# Patient Record
Sex: Male | Born: 1985 | Race: Black or African American | Hispanic: No | Marital: Single | Smoking: Never smoker
Health system: Southern US, Community
[De-identification: ages and names within clinical notes are randomized; demographics above are authoritative.]

## PROBLEM LIST (undated history)

## (undated) DIAGNOSIS — K219 Gastro-esophageal reflux disease without esophagitis: Secondary | ICD-10-CM

## (undated) DIAGNOSIS — F209 Schizophrenia, unspecified: Secondary | ICD-10-CM

## (undated) DIAGNOSIS — K5792 Diverticulitis of intestine, part unspecified, without perforation or abscess without bleeding: Secondary | ICD-10-CM

## (undated) DIAGNOSIS — F79 Unspecified intellectual disabilities: Secondary | ICD-10-CM

## (undated) DIAGNOSIS — K3184 Gastroparesis: Secondary | ICD-10-CM

## (undated) DIAGNOSIS — K30 Functional dyspepsia: Secondary | ICD-10-CM

## (undated) DIAGNOSIS — R569 Unspecified convulsions: Secondary | ICD-10-CM

## (undated) HISTORY — PX: TONSILLECTOMY: SUR1361

---
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
## 2003-03-26 ENCOUNTER — Emergency Department (HOSPITAL_COMMUNITY): Admission: EM | Admit: 2003-03-26 | Discharge: 2003-03-26 | Payer: Self-pay | Admitting: Emergency Medicine

## 2003-03-26 ENCOUNTER — Encounter: Payer: Self-pay | Admitting: Emergency Medicine

## 2010-04-07 DIAGNOSIS — G40909 Epilepsy, unspecified, not intractable, without status epilepticus: Secondary | ICD-10-CM | POA: Insufficient documentation

## 2013-05-03 DIAGNOSIS — R5381 Other malaise: Secondary | ICD-10-CM | POA: Insufficient documentation

## 2013-05-03 DIAGNOSIS — F209 Schizophrenia, unspecified: Secondary | ICD-10-CM | POA: Insufficient documentation

## 2013-05-03 DIAGNOSIS — G35 Multiple sclerosis: Secondary | ICD-10-CM | POA: Insufficient documentation

## 2013-05-03 DIAGNOSIS — A048 Other specified bacterial intestinal infections: Secondary | ICD-10-CM | POA: Insufficient documentation

## 2013-05-26 DIAGNOSIS — K219 Gastro-esophageal reflux disease without esophagitis: Secondary | ICD-10-CM | POA: Insufficient documentation

## 2013-09-25 ENCOUNTER — Emergency Department (HOSPITAL_COMMUNITY)
Admission: EM | Admit: 2013-09-25 | Discharge: 2013-09-25 | Disposition: A | Discharge status: Home / Self Care | Payer: Medicare Other | Attending: Emergency Medicine | Admitting: Emergency Medicine

## 2013-09-25 ENCOUNTER — Encounter (HOSPITAL_COMMUNITY): Payer: Self-pay | Admitting: Emergency Medicine

## 2013-09-25 ENCOUNTER — Emergency Department (HOSPITAL_COMMUNITY): Payer: Medicare Other

## 2013-09-25 DIAGNOSIS — R1013 Epigastric pain: Secondary | ICD-10-CM | POA: Insufficient documentation

## 2013-09-25 DIAGNOSIS — K219 Gastro-esophageal reflux disease without esophagitis: Secondary | ICD-10-CM | POA: Insufficient documentation

## 2013-09-25 DIAGNOSIS — R1110 Vomiting, unspecified: Secondary | ICD-10-CM | POA: Insufficient documentation

## 2013-09-25 DIAGNOSIS — R55 Syncope and collapse: Secondary | ICD-10-CM | POA: Insufficient documentation

## 2013-09-25 DIAGNOSIS — F209 Schizophrenia, unspecified: Secondary | ICD-10-CM | POA: Insufficient documentation

## 2013-09-25 DIAGNOSIS — Z79899 Other long term (current) drug therapy: Secondary | ICD-10-CM | POA: Insufficient documentation

## 2013-09-25 DIAGNOSIS — G40909 Epilepsy, unspecified, not intractable, without status epilepticus: Secondary | ICD-10-CM | POA: Insufficient documentation

## 2013-09-25 DIAGNOSIS — R404 Transient alteration of awareness: Secondary | ICD-10-CM | POA: Insufficient documentation

## 2013-09-25 HISTORY — DX: Unspecified intellectual disabilities: F79

## 2013-09-25 HISTORY — DX: Gastro-esophageal reflux disease without esophagitis: K21.9

## 2013-09-25 HISTORY — DX: Unspecified convulsions: R56.9

## 2013-09-25 HISTORY — DX: Schizophrenia, unspecified: F20.9

## 2013-09-25 HISTORY — DX: Diverticulitis of intestine, part unspecified, without perforation or abscess without bleeding: K57.92

## 2013-09-25 LAB — BASIC METABOLIC PANEL
Chloride: 101 mEq/L (ref 96–112)
GFR calc Af Amer: >90 mL/min (ref >90)
GFR calc non Af Amer: >90 mL/min (ref >90)
Potassium: 3.6 mEq/L (ref 3.5–5.1)
Sodium: 138 mEq/L (ref 135–145)

## 2013-09-25 LAB — CBC
HCT: 39 % (ref 39.0–52.0)
MCHC: 36.9 g/dL — ABNORMAL HIGH (ref 30.0–36.0)
Platelets: 212 10*3/uL (ref 150–400)
RDW: 12.6 % (ref 11.5–15.5)
WBC: 4 10*3/uL (ref 4.0–10.5)

## 2013-09-25 LAB — POCT I-STAT TROPONIN I: Troponin i, poc: 0 ng/mL (ref 0.00–0.08)

## 2013-09-25 LAB — VALPROIC ACID LEVEL: Valproic Acid Lvl: 26 ug/mL — ABNORMAL LOW (ref 50.0–100.0)

## 2013-09-25 MED ORDER — DIVALPROEX SODIUM 250 MG PO DR TAB
250.0000 mg | DELAYED_RELEASE_TABLET | Freq: Once | ORAL | Status: AC
  Administered 2013-09-25: 250 mg via ORAL
  Filled 2013-09-25: qty 1

## 2013-09-25 MED ORDER — LIDOCAINE VISCOUS 2 % MT SOLN
15.0000 mL | Freq: Once | OROMUCOSAL | Status: AC
  Administered 2013-09-25: 15 mL via OROMUCOSAL
  Filled 2013-09-25: qty 15

## 2013-09-25 NOTE — ED Provider Notes (Signed)
CSN: [id number]     Arrival date & time 09/25/13  1401 History   None    Chief Complaint  Patient presents with  . Chest Pain   (Consider location/radiation/quality/duration/timing/severity/associated sxs/prior Treatment) The history is provided by the patient and a caregiver. No language interpreter was used.   Patient is a 28 year old African American male with past medical history of GERD, MR, and schizophrenia who comes to the emergency department today with chest pain, syncope, and vomiting. He has had vomiting for several months. Is being evaluated by GI at [facility] for this. He had an episode of vomiting today that was more severe than usual. During the vomiting he briefly lost consciousness for approximately 5 seconds. After the incident he returned to his baseline mental function quickly. When he regained consciousness he complained of some epigastric pain. The pain is worse with vomiting. It was not worse with exertion. He denies fevers, chills, diarrhea, shortness of breath, or pleuritic chest pain.  Past Medical History  Diagnosis Date  . Mental retardation   . GERD (gastroesophageal reflux disease)   . Diverticulitis   . Schizophrenia   . Seizures     Generalized epilipsy   Past Surgical History  Procedure Laterality Date  . Tonsillectomy     Family History  Problem Relation Age of Onset  . Diabetes Other    History  Substance Use Topics  . Smoking status: Never Smoker   . Smokeless tobacco: Never Used  . Alcohol Use: No    Review of Systems  Constitutional: Negative for fever and chills.  Respiratory: Negative for cough and shortness of breath.   Cardiovascular: Negative for chest pain.  Gastrointestinal: Positive for vomiting and abdominal pain. Negative for nausea, diarrhea and constipation.  Genitourinary: Negative for dysuria, urgency and frequency.  Musculoskeletal: Negative for neck pain.  Skin: Negative for wound.  All other  systems reviewed and are negative.    Allergies  Review of patient's allergies indicates no known allergies.  Home Medications   Current Outpatient Rx  Name  Route  Sig  Dispense  Refill  . clonazePAM (KLONOPIN) 1 MG tablet   Oral   Take 1 mg by mouth 2 (two) times daily as needed for anxiety.         . divalproex (DEPAKOTE ER) 250 MG 24 hr tablet   Oral   Take 250 mg by mouth daily.         Marland Kitchen docusate sodium (COLACE) 100 MG capsule   Oral   Take 100 mg by mouth 2 (two) times daily as needed for constipation.         Marland Kitchen esomeprazole (NEXIUM) 40 MG capsule   Oral   Take 40 mg by mouth daily before breakfast.         . gabapentin (NEURONTIN) 800 MG tablet   Oral   Take 800 mg by mouth 3 (three) times daily.         . ranitidine (ZANTAC) 150 MG tablet   Oral   Take 150 mg by mouth 2 (two) times daily.         . ziprasidone (GEODON) 20 MG capsule   Oral   Take 20 mg by mouth 2 (two) times daily with a meal.          BP 112/65  Pulse 73  Temp(Src) 97.7 F (36.5 C) (Oral)  Resp 18  SpO2 100% Physical Exam  Nursing note and vitals reviewed. Constitutional: He is  oriented to person, place, and time. He appears well-developed and well-nourished. No distress.  HENT:  Head: Normocephalic and atraumatic.  Eyes: Pupils are equal, round, and reactive to light.  Neck: Normal range of motion.  Cardiovascular: Normal rate, regular rhythm and normal heart sounds.   Pulmonary/Chest: Effort normal and breath sounds normal. No respiratory distress. He has no decreased breath sounds. He has no wheezes. He has no rhonchi. He has no rales. He exhibits no tenderness and no bony tenderness.  Abdominal: Soft. He exhibits no distension. There is no tenderness. There is no rigidity, no rebound, no guarding, no CVA tenderness, no tenderness at McBurney's point and negative Murphy's sign.  Musculoskeletal: He exhibits no edema and no tenderness.  Neurological: He is alert and  oriented to person, place, and time. No cranial nerve deficit or sensory deficit. He exhibits normal muscle tone. Coordination normal.  Skin: Skin is warm and dry.  Psychiatric: His affect is blunt. His speech is delayed. He is slowed.    ED Course  Procedures (including critical care time) Labs Review Labs Reviewed  CBC - Abnormal; Notable for the following:    MCHC 36.9 (*)    All other components within normal limits  BASIC METABOLIC PANEL - Abnormal; Notable for the following:    Glucose, Bld 131 (*)    All other components within normal limits  VALPROIC ACID LEVEL - Abnormal; Notable for the following:    Valproic Acid Lvl 26.0 (*)    All other components within normal limits  POCT I-STAT TROPONIN I   Imaging Review Dg Chest Port 1 View  09/25/2013   CLINICAL DATA:  Syncope and chest pain  EXAM: PORTABLE CHEST - 1 VIEW  COMPARISON:  None.  FINDINGS: The lungs are clear. Heart size and pulmonary vascularity are normal. No adenopathy. No pneumothorax. No bone lesions.  IMPRESSION: No abnormality noted.   Electronically Signed   By: Bretta Bang M.D.   On: 09/25/2013 15:11    EKG Interpretation   None       MDM  Patient is a 27 year old male with past medical history schizophrenia and vomiting who comes emergency department today with syncope and chest pain after vomiting episode. Physical exam as above. History is consistent with a vasovagal syncope in the vomiting episode. The epigastric pain is felt likely secondary to his and severe vomiting. Initial workup included chest x-ray, BMP, CBC, troponin, and a valproic acid. Protested level was low at 26. Since he has been vomiting unable to take his medications was administered a dose of valproic acid in the emergency department. Acetone was negative. CBC was unremarkable. BMP was unremarkable. Chest x-ray was unremarkable. The patient has had vomiting for approximately 2 years and is being evaluated by GI specialist is not  felt to require further evaluation for this at this time. Patient was felt to be stable for discharge. He was instructed to followup with his GI doctor as previously scheduled. He was instructed to return to the emergency department if he develops worsening pain, inability to tolerate by mouth, or any other concerns. His caregiver expressed understanding. Patient was discharged in stable condition. Labs and imaging reviewed by myself and considered and medical decision-making. Imaging was interpreted by radiology. Care was discussed with my attending Dr. Rubin Payor.    1. Vasovagal syncope   2. Vomiting   3. Epigastric pain        Bethann Berkshire, MD 09/26/13 [phone]

## 2013-09-25 NOTE — ED Notes (Signed)
Per EMS: Pt picked up from [employer], pt had a witnessed syncopal episode lasting 5-10 seconds. Pt was standing but was caught by bystander, no impact. Pt does not remember the event. C/o chest pain on the left side and nausea. A&O at baseline when EMS arrived. Given 4mg  Zofran, 324 ASA, 1 of Nitro. Hx of MR, Schizophrenia.

## 2013-09-28 NOTE — ED Provider Notes (Signed)
I saw and evaluated the patient, reviewed the resident's note and I agree with the findings and plan.  EKG Interpretation     Ventricular Rate:  76 PR Interval:  129 QRS Duration: 83 QT Interval:  368 QTC Calculation: 414 R Axis:   77 Text Interpretation:  Sinus rhythm baseline artifacte.            Patient with syncope. Doubt cardiac cause. Patient is at his baseline. Will discharge  Juliet Rude. Rubin Payor, MD 09/28/13 2148

## 2013-12-31 DIAGNOSIS — R110 Nausea: Secondary | ICD-10-CM | POA: Insufficient documentation

## 2014-01-26 ENCOUNTER — Emergency Department (HOSPITAL_COMMUNITY)
Admission: EM | Admit: 2014-01-26 | Discharge: 2014-01-26 | Disposition: A | Discharge status: Home / Self Care | Payer: Medicare Other | Attending: Emergency Medicine | Admitting: Emergency Medicine

## 2014-01-26 ENCOUNTER — Encounter (HOSPITAL_COMMUNITY): Payer: Self-pay | Admitting: Emergency Medicine

## 2014-01-26 DIAGNOSIS — G40909 Epilepsy, unspecified, not intractable, without status epilepticus: Secondary | ICD-10-CM | POA: Insufficient documentation

## 2014-01-26 DIAGNOSIS — K219 Gastro-esophageal reflux disease without esophagitis: Secondary | ICD-10-CM | POA: Insufficient documentation

## 2014-01-26 DIAGNOSIS — Z79899 Other long term (current) drug therapy: Secondary | ICD-10-CM | POA: Insufficient documentation

## 2014-01-26 DIAGNOSIS — F79 Unspecified intellectual disabilities: Secondary | ICD-10-CM | POA: Insufficient documentation

## 2014-01-26 DIAGNOSIS — F209 Schizophrenia, unspecified: Secondary | ICD-10-CM | POA: Insufficient documentation

## 2014-01-26 DIAGNOSIS — R4182 Altered mental status, unspecified: Secondary | ICD-10-CM | POA: Insufficient documentation

## 2014-01-26 HISTORY — DX: Gastroparesis: K31.84

## 2014-01-26 HISTORY — DX: Functional dyspepsia: K30

## 2014-01-26 LAB — CBC
HCT: 41.3 % (ref 39.0–52.0)
Hemoglobin: 15.6 g/dL (ref 13.0–17.0)
MCH: 32.7 pg (ref 26.0–34.0)
MCHC: 37.8 g/dL — ABNORMAL HIGH (ref 30.0–36.0)
MCV: 86.6 fL (ref 78.0–100.0)
PLATELETS: 224 10*3/uL (ref 150–400)
RBC: 4.77 MIL/uL (ref 4.22–5.81)
RDW: 12.2 % (ref 11.5–15.5)
WBC: 5.6 10*3/uL (ref 4.0–10.5)

## 2014-01-26 LAB — VALPROIC ACID LEVEL: Valproic Acid Lvl: 30.3 ug/mL — ABNORMAL LOW (ref 50.0–100.0)

## 2014-01-26 LAB — BASIC METABOLIC PANEL
BUN: 14 mg/dL (ref 6–23)
CO2: 28 meq/L (ref 19–32)
Calcium: 9.8 mg/dL (ref 8.4–10.5)
Chloride: 99 mEq/L (ref 96–112)
Creatinine, Ser: 0.75 mg/dL (ref 0.50–1.35)
GFR calc Af Amer: >90 mL/min (ref >90)
GFR calc non Af Amer: >90 mL/min (ref >90)
GLUCOSE: 137 mg/dL — AB (ref 70–99)
POTASSIUM: 4.1 meq/L (ref 3.7–5.3)
Sodium: 140 mEq/L (ref 137–147)

## 2014-01-26 LAB — CBG MONITORING, ED: Glucose-Capillary: 128 mg/dL — ABNORMAL HIGH (ref 70–99)

## 2014-01-26 NOTE — ED Provider Notes (Signed)
CSN: [id number]     Arrival date & time 01/26/14  1411 History   First MD Initiated Contact with Patient 01/26/14 1635     Chief Complaint  Patient presents with  . Altered Mental Status     (Consider location/radiation/quality/duration/timing/severity/associated sxs/prior Treatment) HPI Comments: 28 yo AA male with h/o MR, GERD, Diverticulitis, schizophrenia, seizures, gastroparesis, presents with cc of "vomiting, and mouth being held in fixed position".  History obtained from foster mother.  She states he hasn't been keeping his PM medications down.    Pt takes depakote ER, nexium, klonipin, azithromycin (for gastroparesis), neurontin, miralax, zantac, geodon.  NKDA.  PCP: [facility] Micael Hampshire, MD GI: Dr. Alycia Rossetti Psychiatrist: Dr. Caswell Corwin mother states pt was floating his teeth out, and grunting, ambulating without issue.    At time of interview - approximately 1640.  Pt is alert, conversant, and without complaint.  Pt states he "feels good".  We talked about college basketball, football, and he had baseline MS and speech.    No h/o HA, seizure, CP, SOB, cough.  Pt has h/o chronic n/v associated with gastroparesis.    Patient is a 28 y.o. male presenting with altered mental status. The history is provided by the patient and a relative (foster mother ). The history is limited by a developmental delay.  Altered Mental Status Presenting symptoms: behavior changes and disorientation   Presenting symptoms: no combativeness, no confusion, no lethargy, no memory loss, no partial responsiveness and no unresponsiveness     Past Medical History  Diagnosis Date  . Mental retardation   . GERD (gastroesophageal reflux disease)   . Diverticulitis   . Schizophrenia   . Seizures     Generalized epilipsy  . Gastroparesis   . Delayed gastric emptying    Past Surgical History  Procedure Laterality Date  . Tonsillectomy     Family History  Problem Relation  Age of Onset  . Diabetes Other    History  Substance Use Topics  . Smoking status: Never Smoker   . Smokeless tobacco: Never Used  . Alcohol Use: Yes     Comment: occ    Review of Systems  Psychiatric/Behavioral: Negative for memory loss and confusion.      Allergies  Review of patient's allergies indicates no known allergies.  Home Medications   Current Outpatient Rx  Name  Route  Sig  Dispense  Refill  . azithromycin (ZITHROMAX) 200 MG/5ML suspension   Oral   Take 250 mg by mouth daily. Started three weeks ago from today (01-26-14)         . clonazePAM (KLONOPIN) 1 MG tablet   Oral   Take 1 mg by mouth 2 (two) times daily as needed for anxiety.         . divalproex (DEPAKOTE ER) 250 MG 24 hr tablet   Oral   Take 250 mg by mouth daily.         Marland Kitchen esomeprazole (NEXIUM) 40 MG capsule   Oral   Take 40 mg by mouth daily before breakfast.         . gabapentin (NEURONTIN) 800 MG tablet   Oral   Take 800 mg by mouth 3 (three) times daily.         . polyethylene glycol (MIRALAX / GLYCOLAX) packet   Oral   Take 17 g by mouth at bedtime.         . ranitidine (ZANTAC) 150 MG tablet  Oral   Take 150 mg by mouth 2 (two) times daily.         . ziprasidone (GEODON) 20 MG capsule   Oral   Take 20 mg by mouth 2 (two) times daily with a meal.          BP 117/76  Pulse 79  Temp(Src) 97.9 F (36.6 C) (Oral)  Resp 14  Wt 120 lb (54.432 kg)  SpO2 99% Physical Exam  Nursing note and vitals reviewed. Constitutional: He appears well-developed and well-nourished.  HENT:  Head: Normocephalic and atraumatic.  Right Ear: External ear normal.  Left Ear: External ear normal.  Nose: Nose normal.  Mouth/Throat: Oropharynx is clear and moist.  Eyes: Conjunctivae are normal. Right eye exhibits no discharge. Left eye exhibits no discharge.  Neck: Normal range of motion. Neck supple. No JVD present. No tracheal deviation present.  Cardiovascular: Normal rate and  regular rhythm.  Exam reveals no friction rub.   No murmur heard. Pulmonary/Chest: Effort normal and breath sounds normal. No stridor.  Abdominal: Soft. Bowel sounds are normal. He exhibits no distension and no mass. There is no tenderness. There is no rebound and no guarding.  Genitourinary: Rectum normal and penis normal. Guaiac negative stool. No penile tenderness.  Musculoskeletal: Normal range of motion. He exhibits no edema and no tenderness.  Lymphadenopathy:    He has no cervical adenopathy.  Neurological: He is alert. He has normal reflexes. No cranial nerve deficit. Coordination normal. GCS eye subscore is 4. GCS verbal subscore is 5. GCS motor subscore is 6.  Skin: Skin is warm and dry.    ED Course  Procedures (including critical care time) Labs Review  Results for orders placed during the hospital encounter of 01/26/14  BASIC METABOLIC PANEL      Result Value Ref Range   Sodium 140  137 - 147 mEq/L   Potassium 4.1  3.7 - 5.3 mEq/L   Chloride 99  96 - 112 mEq/L   CO2 28  19 - 32 mEq/L   Glucose, Bld 137 (*) 70 - 99 mg/dL   BUN 14  6 - 23 mg/dL   Creatinine, Ser 7.820.75  0.50 - 1.35 mg/dL   Calcium 9.8  8.4 - 95.610.5 mg/dL   GFR calc non Af Amer >90  >90 mL/min   GFR calc Af Amer >90  >90 mL/min  VALPROIC ACID LEVEL      Result Value Ref Range   Valproic Acid Lvl 30.3 (*) 50.0 - 100.0 ug/mL  CBC      Result Value Ref Range   WBC 5.6  4.0 - 10.5 K/uL   RBC 4.77  4.22 - 5.81 MIL/uL   Hemoglobin 15.6  13.0 - 17.0 g/dL   HCT 21.341.3  08.639.0 - 57.852.0 %   MCV 86.6  78.0 - 100.0 fL   MCH 32.7  26.0 - 34.0 pg   MCHC 37.8 (*) 30.0 - 36.0 g/dL   RDW 46.912.2  62.911.5 - 52.815.5 %   Platelets 224  150 - 400 K/uL  CBG MONITORING, ED      Result Value Ref Range   Glucose-Capillary 128 (*) 70 - 99 mg/dL    Date: 41/32/440103/12/2013  Rate: 98  Rhythm: normal sinus rhythm  QRS Axis: right  Intervals: normal  ST/T Wave abnormalities: nonspecific ST changes and nonspecific T wave changes  Conduction  Disutrbances:none  Narrative Interpretation:   Old EKG Reviewed:     MDM   Final diagnoses:  Altered mental status  MR (mental retardation)  Seizure disorder   28 year old Afro-American male with history of mental retardation, schizophrenia, seizures, gastroparesis, and diverticulitis presents emergency Department with his foster mother who was concerned with his mental status.  Per her report she was concerned with him clenching his teeth and his abnormal eye movement. During these episodes at home he was ambulatory without associated neurologic deficits. She is also concerned about nausea and vomiting due to his gastroparesis. She states she was unsure if the above symptom or associated with his behavioral and psychologic disorder or if this was something new.  Of note the patient had a normal neurologic exam, and was alert and oriented, and no abnormal behavior was noted. He denied symptoms during entire ER stay.    Patient had a prolonged stay in the ER for observation to see if this behavior would reoccur. Labwork ordered from triage reveals blood glucose at 128, basic metabolic panel unremarkable except for glucose 137, valproic acid was low at 30, CBC unremarkable.  After prolonged observation, foster-mother requested to be discharged home. No evidence of seizure, serious bacterial illness, stroke, TIA, or other emergent etiology. Patient is without chest pain, shortness of breath, headache, or new neurologic symptoms in ER. No indication for admission or further consultation at this time  I informed her that the patient's valproic acid level was low and encouraged her to continue these medications. I also encouraged followup with primary care provider as soon as possible. She stated he could be seen within the next day or so. He also has a followup appointment with gastroenterology this week. Strict ER precautions were given and the patient's foster mother agrees with this  plan.    Darlys Gales, MD 01/26/14 [phone]

## 2014-01-26 NOTE — ED Notes (Addendum)
Pt brought in by foster mother for pt setting is mouth in fixed position and making repetative noises.  Pt has hx of seizures (foster mom does not know what kind) but has not been keeping evening meds down d/t purposeful vomiting/vomiting d/t epigastric pain (hx of schizophrenia and MR).  VS stable.  Pt with copious amount of spit that pt keeps using tissue to clean up (this is per norm).

## 2014-01-26 NOTE — Discharge Instructions (Signed)
Epilepsy Epilepsy is a disorder in which a person has repeated seizures over time. A seizure is a release of abnormal electrical activity in the brain. Seizures can cause a change in attention, behavior, or the ability to remain awake and alert (altered mental status). Seizures often involve uncontrollable shaking (convulsions).  Most people with epilepsy lead normal lives. However, people with epilepsy are at an increased risk of falls, accidents, and injuries. Therefore, it is important to begin treatment right away. CAUSES  Epilepsy has many possible causes. Anything that disturbs the normal pattern of brain cell activity can lead to seizures. This may include:   Head injury.  Birth trauma.  High fever as a child.  Stroke.  Bleeding into or around the brain.  Certain drugs.  Prolonged low oxygen, such as what occurs after CPR efforts.  Abnormal brain development.  Certain illnesses, such as meningitis, encephalitis (brain infection), malaria, and other infections.  An imbalance of nerve signaling chemicals (neurotransmitters).  SIGNS AND SYMPTOMS  The symptoms of a seizure can vary greatly from one person to another. Right before a seizure, you may have a warning (aura) that a seizure is about to occur. An aura may include the following symptoms:  Fear or anxiety.  Nausea.  Feeling like the room is spinning (vertigo).  Vision changes, such as seeing flashing lights or spots. Common symptoms during a seizure include:  Abnormal sensations, such as an abnormal smell or a bitter taste in the mouth.   Sudden, general body stiffness.   Convulsions that involve rhythmic jerking of the face, arm, or leg on one or both sides.   Sudden change in consciousness.   Appearing to be awake but not responding.   Appearing to be asleep but cannot be awakened.   Grimacing, chewing, lip smacking, drooling, tongue biting, or loss of bowel or bladder control. After a seizure,  you may feel sleepy for a while. DIAGNOSIS  Your health care provider will ask about your symptoms and take a medical history. Descriptions from any witnesses to your seizures will be very helpful in the diagnosis. A physical exam, including a detailed neurological exam, is necessary. Various tests may be done, such as:   An electroencephalogram (EEG). This is a painless test of your brain waves. In this test, a diagram is created of your brain waves. These diagrams can be interpreted by a specialist.  An MRI of the brain.   A CT scan of the brain.   A spinal tap (lumbar puncture, LP).  Blood tests to check for signs of infection or abnormal blood chemistry. TREATMENT  There is no cure for epilepsy, but it is generally treatable. Once epilepsy is diagnosed, it is important to begin treatment as soon as possible. For most people with epilepsy, seizures can be controlled with medicines. The following may also be used:  A pacemaker for the brain (vagus nerve stimulator) can be used for people with seizures that are not well controlled by medicine.  Surgery on the brain. For some people, epilepsy eventually goes away. HOME CARE INSTRUCTIONS   Follow your health care provider's recommendations on driving and safety in normal activities.  Get enough rest. Lack of sleep can cause seizures.  Only take over-the-counter or prescription medicines as directed by your health care provider. Take any prescribed medicine exactly as directed.  Avoid any known triggers of your seizures.  Keep a seizure diary. Record what you recall about any seizure, especially any possible trigger.   Make   sure the people you live and work with know that you are prone to seizures. They should receive instructions on how to help you. In general, a witness to a seizure should:   Cushion your head and body.   Turn you on your side.   Avoid unnecessarily restraining you.   Not place anything inside your  mouth.   Call for emergency medical help if there is any question about what has occurred.   Follow up with your health care provider as directed. You may need regular blood tests to monitor the levels of your medicine.  SEEK MEDICAL CARE IF:   You develop signs of infection or other illness. This might increase the risk of a seizure.   You seem to be having more frequent seizures.   Your seizure pattern is changing.  SEEK IMMEDIATE MEDICAL CARE IF:   You have a seizure that does not stop after a few moments.   You have a seizure that causes any difficulty in breathing.   You have a seizure that results in a very severe headache.   You have a seizure that leaves you with the inability to speak or use a part of your body.  Document Released: 11/13/2005 Document Revised: 09/03/2013 Document Reviewed: 06/25/2013 [employer] Patient Information 2014 [employer].  

## 2014-01-26 NOTE — ED Notes (Signed)
Pt's foster mother inguires when pt will get discharge. Dr. Redgie GrayerMasneri notified and he states he will discharge pt very soon. Pt and foster mother informed.

## 2014-04-01 DIAGNOSIS — K3184 Gastroparesis: Secondary | ICD-10-CM | POA: Insufficient documentation

## 2015-04-23 ENCOUNTER — Emergency Department (HOSPITAL_COMMUNITY): Payer: Medicare Other

## 2015-04-23 ENCOUNTER — Emergency Department (HOSPITAL_COMMUNITY)
Admission: EM | Admit: 2015-04-23 | Discharge: 2015-04-24 | Disposition: A | Discharge status: Home / Self Care | Payer: Medicare Other | Attending: Emergency Medicine | Admitting: Emergency Medicine

## 2015-04-23 ENCOUNTER — Encounter (HOSPITAL_COMMUNITY): Payer: Self-pay | Admitting: *Deleted

## 2015-04-23 DIAGNOSIS — H9209 Otalgia, unspecified ear: Secondary | ICD-10-CM | POA: Diagnosis not present

## 2015-04-23 DIAGNOSIS — R1084 Generalized abdominal pain: Secondary | ICD-10-CM | POA: Diagnosis not present

## 2015-04-23 DIAGNOSIS — G40909 Epilepsy, unspecified, not intractable, without status epilepticus: Secondary | ICD-10-CM | POA: Insufficient documentation

## 2015-04-23 DIAGNOSIS — F79 Unspecified intellectual disabilities: Secondary | ICD-10-CM | POA: Diagnosis not present

## 2015-04-23 DIAGNOSIS — Z79899 Other long term (current) drug therapy: Secondary | ICD-10-CM | POA: Diagnosis not present

## 2015-04-23 DIAGNOSIS — R05 Cough: Secondary | ICD-10-CM | POA: Diagnosis present

## 2015-04-23 DIAGNOSIS — J069 Acute upper respiratory infection, unspecified: Secondary | ICD-10-CM | POA: Diagnosis not present

## 2015-04-23 DIAGNOSIS — K219 Gastro-esophageal reflux disease without esophagitis: Secondary | ICD-10-CM | POA: Insufficient documentation

## 2015-04-23 DIAGNOSIS — F209 Schizophrenia, unspecified: Secondary | ICD-10-CM | POA: Insufficient documentation

## 2015-04-23 LAB — COMPREHENSIVE METABOLIC PANEL
ALBUMIN: 3.9 g/dL (ref 3.5–5.0)
ALT: 183 U/L — AB (ref 17–63)
AST: 241 U/L — ABNORMAL HIGH (ref 15–41)
Alkaline Phosphatase: 108 U/L (ref 38–126)
Anion gap: 10 (ref 5–15)
BUN: 14 mg/dL (ref 6–20)
CO2: 29 mmol/L (ref 22–32)
Calcium: 9 mg/dL (ref 8.9–10.3)
Chloride: 98 mmol/L — ABNORMAL LOW (ref 101–111)
Creatinine, Ser: 0.77 mg/dL (ref 0.61–1.24)
GFR calc Af Amer: >60 mL/min (ref >60)
Glucose, Bld: 94 mg/dL (ref 65–99)
POTASSIUM: 4.1 mmol/L (ref 3.5–5.1)
SODIUM: 137 mmol/L (ref 135–145)
Total Bilirubin: 0.6 mg/dL (ref 0.3–1.2)
Total Protein: 7.5 g/dL (ref 6.5–8.1)

## 2015-04-23 LAB — URINALYSIS, ROUTINE W REFLEX MICROSCOPIC
BILIRUBIN URINE: NEGATIVE
GLUCOSE, UA: NEGATIVE mg/dL
Hgb urine dipstick: NEGATIVE
KETONES UR: NEGATIVE mg/dL
Leukocytes, UA: NEGATIVE
NITRITE: NEGATIVE
Protein, ur: 30 mg/dL — AB
SPECIFIC GRAVITY, URINE: 1.029 (ref 1.005–1.030)
Urobilinogen, UA: 1 mg/dL (ref 0.0–1.0)
pH: 7.5 (ref 5.0–8.0)

## 2015-04-23 LAB — URINE MICROSCOPIC-ADD ON

## 2015-04-23 LAB — CBC WITH DIFFERENTIAL/PLATELET
BASOS ABS: 0 10*3/uL (ref 0.0–0.1)
Basophils Relative: 0 % (ref 0–1)
Eosinophils Absolute: 0.2 10*3/uL (ref 0.0–0.7)
Eosinophils Relative: 4 % (ref 0–5)
HCT: 39.1 % (ref 39.0–52.0)
Hemoglobin: 14.4 g/dL (ref 13.0–17.0)
Lymphocytes Relative: 47 % — ABNORMAL HIGH (ref 12–46)
Lymphs Abs: 2.2 10*3/uL (ref 0.7–4.0)
MCH: 31.9 pg (ref 26.0–34.0)
MCHC: 36.8 g/dL — ABNORMAL HIGH (ref 30.0–36.0)
MCV: 86.7 fL (ref 78.0–100.0)
MONOS PCT: 15 % — AB (ref 3–12)
Monocytes Absolute: 0.7 10*3/uL (ref 0.1–1.0)
NEUTROS PCT: 34 % — AB (ref 43–77)
Neutro Abs: 1.6 10*3/uL — ABNORMAL LOW (ref 1.7–7.7)
PLATELETS: 185 10*3/uL (ref 150–400)
RBC: 4.51 MIL/uL (ref 4.22–5.81)
RDW: 11.9 % (ref 11.5–15.5)
WBC: 4.8 10*3/uL (ref 4.0–10.5)

## 2015-04-23 LAB — RAPID STREP SCREEN (MED CTR MEBANE ONLY): STREPTOCOCCUS, GROUP A SCREEN (DIRECT): NEGATIVE

## 2015-04-23 LAB — LIPASE, BLOOD: LIPASE: 15 U/L — AB (ref 22–51)

## 2015-04-23 MED ORDER — AEROCHAMBER PLUS W/MASK MISC
1.0000 | Freq: Once | Status: AC
  Administered 2015-04-23: 1
  Filled 2015-04-23: qty 1

## 2015-04-23 MED ORDER — ALBUTEROL SULFATE HFA 108 (90 BASE) MCG/ACT IN AERS
2.0000 | INHALATION_SPRAY | RESPIRATORY_TRACT | Status: DC
  Administered 2015-04-23: 2 via RESPIRATORY_TRACT
  Filled 2015-04-23: qty 6.7

## 2015-04-23 MED ORDER — HYDROCODONE-HOMATROPINE 5-1.5 MG/5ML PO SYRP: 5.0000 mL | ORAL_SOLUTION | Freq: Four times a day (QID) | ORAL | Status: DC | PRN

## 2015-04-23 NOTE — ED Notes (Signed)
Pt reports cough with yellow mucus, abdominal pain, n/v, and headache since Tuesday.

## 2015-04-23 NOTE — ED Provider Notes (Signed)
CSN: [id number]     Arrival date & time 04/23/15  1947 History   First MD Initiated Contact with Patient 04/23/15 2158     Chief Complaint  Patient presents with  . Cough  . Abdominal Pain     (Consider location/radiation/quality/duration/timing/severity/associated sxs/prior Treatment) Patient is a 29 y.o. male presenting with cough and abdominal pain. The history is provided by the patient and a caregiver. No language interpreter was used.  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 He also states that he has abdominal pain. His caregiver states that she has given him Alka-Seltzer cold and flu with minimal relief. His mom is also sick at home. He denies any fever, chest pain, vomiting, dysuria, hematuria, urinary frequency. Past Medical History  Diagnosis Date  . Mental retardation   . GERD (gastroesophageal reflux disease)   . Diverticulitis   . Schizophrenia   . Seizures     Generalized epilipsy  . Gastroparesis   . Delayed gastric emptying    Past Surgical History  Procedure Laterality Date  . Tonsillectomy     Family History  Problem Relation Age of Onset  . Diabetes Other    History  Substance Use Topics  . Smoking status: Never Smoker   . Smokeless tobacco: Never Used  . Alcohol Use: Yes     Comment: occ    Review of Systems  Constitutional: Negative for fever and chills.  HENT: Positive for congestion, ear pain, sneezing, sore throat and voice change. Negative for rhinorrhea and trouble swallowing.   Eyes: Negative for itching.  Respiratory: Positive for cough.   Gastrointestinal:  Positive for abdominal pain.  All other systems reviewed and are negative.     Allergies  Review of patient's allergies indicates no known allergies.  Home Medications   Prior to Admission medications   Medication Sig Start Date End Date Taking? Authorizing Provider  Asenapine Maleate 10 MG SUBL Place 10 mg under the tongue daily.   Yes Historical Provider, MD  chlorproMAZINE (THORAZINE) 25 MG tablet Take 25 mg by mouth 3 (three) times daily as needed for nausea or vomiting.   Yes Historical Provider, MD  divalproex (DEPAKOTE ER) 250 MG 24 hr tablet Take 500 mg by mouth daily.    Yes Historical Provider, MD  esomeprazole (NEXIUM) 40 MG capsule Take 40 mg by mouth daily before breakfast.   Yes Historical Provider, MD  OXcarbazepine (TRILEPTAL) 150 MG tablet Take 150 mg by mouth daily.   Yes Historical Provider, MD  promethazine (PHENERGAN) 25 MG tablet Take 25 mg by mouth every 6 (six) hours as needed for nausea or vomiting.   Yes Historical Provider, MD  propranolol (INDERAL) 10 MG tablet Take 10 mg by mouth daily.   Yes Historical Provider, MD  ranitidine (ZANTAC) 150 MG tablet Take 150 mg by mouth daily.    Yes Historical Provider, MD  clonazePAM (KLONOPIN) 1 MG tablet Take 1 mg by mouth 2 (two) times daily as needed for anxiety.    Historical Provider, MD  gabapentin (NEURONTIN) 800 MG tablet Take 800 mg by mouth 3 (three) times daily.  Historical Provider, MD  HYDROcodone-homatropine (HYCODAN) 5-1.5 MG/5ML syrup Take 5 mLs by mouth every 6 (six) hours as needed for cough. 04/23/15   Kensley Lares Patel-Mills, PA-C  polyethylene glycol (MIRALAX / GLYCOLAX) packet Take 17 g by mouth at bedtime.    Historical Provider, MD  ziprasidone (GEODON) 20 MG capsule Take 20 mg by mouth 2 (two) times daily with a meal.    Historical Provider, MD   BP 128/76 mmHg  Pulse 78  Temp(Src) 98.3 F (36.8 C) (Oral)  Resp 16  SpO2 95% Physical Exam  Constitutional: He appears well-developed and  well-nourished.  HENT:  Head: Normocephalic and atraumatic.  Right Ear: External ear normal.  Left Ear: External ear normal.  Mouth/Throat: Uvula is midline, oropharynx is clear and moist and mucous membranes are normal. No uvula swelling. No oropharyngeal exudate, posterior oropharyngeal edema, posterior oropharyngeal erythema or tonsillar abscesses.  Eyes: Conjunctivae and EOM are normal.  Neck: Normal range of motion. Neck supple.  Cardiovascular: Normal rate and regular rhythm.   Pulmonary/Chest: Effort normal and breath sounds normal. No respiratory distress. He has no wheezes. He has no rales. He exhibits no tenderness.  Abdominal: Soft. There is generalized tenderness.    ED Course  Procedures (including critical care time) Labs Review Labs Reviewed  CBC WITH DIFFERENTIAL/PLATELET - Abnormal; Notable for the following:    MCHC 36.8 (*)    Neutrophils Relative % 34 (*)    Neutro Abs 1.6 (*)    Lymphocytes Relative 47 (*)    Monocytes Relative 15 (*)    All other components within normal limits  COMPREHENSIVE METABOLIC PANEL - Abnormal; Notable for the following:    Chloride 98 (*)    AST 241 (*)    ALT 183 (*)    All other components within normal limits  LIPASE, BLOOD - Abnormal; Notable for the following:    Lipase 15 (*)    All other components within normal limits  URINALYSIS, ROUTINE W REFLEX MICROSCOPIC (NOT AT [facility]) - Abnormal; Notable for the following:    Color, Urine AMBER (*)    Protein, ur 30 (*)    All other components within normal limits  URINE MICROSCOPIC-ADD ON - Abnormal; Notable for the following:    Squamous Epithelial / LPF FEW (*)    All other components within normal limits  RAPID STREP SCREEN (NOT AT [facility])  CULTURE, GROUP A STREP    Imaging Review Dg Chest 2 View  04/23/2015   CLINICAL DATA:  Cough and congestion for 3 days.  EXAM: CHEST  2 VIEW  COMPARISON:  09/25/2013  FINDINGS: The heart size and mediastinal contours are within normal  limits. Both lungs are clear. No pleural effusion or pneumothorax. The visualized skeletal structures are unremarkable.  IMPRESSION: Normal chest radiographs   Electronically Signed   By: Amie Portland M.D.   On: 04/23/2015 21:00     EKG Interpretation None      MDM   Final diagnoses:  Upper respiratory infection, viral  Patient presents for cough, sore throat, ear pain, generalized abdominal pain. Mom is sick at home the same symptoms. He is currently afebrile, his labs are not concerning, rapid strep is negative. I reviewed the [facility] criteria. Chest x-ray is negative for pneumonia. I have given the patient an albuterol inhaler with a spacer and Hycodan for cough. I believe this is a viral upper respiratory infection and he can follow up with his PCP.    Catha Gosselin, PA-C 04/24/15 0030  Geoffery Lyonsouglas Delo, MD 04/24/15 [phone]

## 2015-04-23 NOTE — Discharge Instructions (Signed)
Upper Respiratory Infection, Adult °An upper respiratory infection (URI) is also known as the common cold. It is often caused by a type of germ (virus). Colds are easily spread (contagious). You can pass it to others by kissing, coughing, sneezing, or drinking out of the same glass. Usually, you get better in 1 or 2 weeks.  °HOME CARE  °· Only take medicine as told by your doctor. °· Use a warm mist humidifier or breathe in steam from a hot shower. °· Drink enough water and fluids to keep your pee (urine) clear or pale yellow. °· Get plenty of rest. °· Return to work when your temperature is back to normal or as told by your doctor. You may use a face mask and wash your hands to stop your cold from spreading. °GET HELP RIGHT AWAY IF:  °· After the first few days, you feel you are getting worse. °· You have questions about your medicine. °· You have chills, shortness of breath, or brown or red spit (mucus). °· You have yellow or brown snot (nasal discharge) or pain in the face, especially when you bend forward. °· You have a fever, puffy (swollen) neck, pain when you swallow, or white spots in the back of your throat. °· You have a bad headache, ear pain, sinus pain, or chest pain. °· You have a high-pitched whistling sound when you breathe in and out (wheezing). °· You have a lasting cough or cough up blood. °· You have sore muscles or a stiff neck. °MAKE SURE YOU:  °· Understand these instructions. °· Will watch your condition. °· Will get help right away if you are not doing well or get worse. °Document Released: 05/01/2008 Document Revised: 02/05/2012 Document Reviewed: 02/18/2014 °ExitCare® Patient Information ©2015 [employer]. This information is not intended to replace advice given to you by your health care provider. Make sure you discuss any questions you have with your health care provider. ° ° °Emergency Department Resource Guide °1) Find a Doctor and Pay Out of Pocket °Although you won't have to find  out who is covered by your insurance plan, it is a good idea to ask around and get recommendations. You will then need to call the office and see if the doctor you have chosen will accept you as a new patient and what types of options they offer for patients who are self-pay. Some doctors offer discounts or will set up payment plans for their patients who do not have insurance, but you will need to ask so you aren't surprised when you get to your appointment. ° °2) Contact Your Local Health Department °Not all health departments have doctors that can see patients for sick visits, but many do, so it is worth a call to see if yours does. If you don't know where your local health department is, you can check in your phone book. The CDC also has a tool to help you locate your state's health department, and many state websites also have listings of all of their local health departments. ° °3) Find a Walk-in Clinic °If your illness is not likely to be very severe or complicated, you may want to try a walk in clinic. These are popping up all over the country in pharmacies, drugstores, and shopping centers. They're usually staffed by nurse practitioners or physician assistants that have been trained to treat common illnesses and complaints. They're usually fairly quick and inexpensive. However, if you have serious medical issues or chronic medical problems, these   are probably not your best option. ° °No Primary Care Doctor: °- Call [facility] Connect at  832-8000 - they can help you locate a primary care doctor that  accepts your insurance, provides certain services, etc. °- Physician Referral Service- 1-800-533-3463 ° °Chronic Pain Problems: °Organization         Address  Phone   Notes  °[facility]  (336) 297-2271 Patients need to be referred by their primary care doctor.  ° °Medication Assistance: °Organization         Address  Phone   Notes  °[place] [facility] [address] °[place], [place] [address] (336) 641-8030 --Must be a resident of [place] °-- Must have NO insurance coverage whatsoever (no Medicaid/ Medicare, etc.) °-- The pt. MUST have a primary care doctor that directs their care regularly and follows them in the community °  °[facility]  (866) 331-1348   °[employer]  (888) 892-1162   ° °Agencies that provide inexpensive medical care: °Organization         Address  Phone   Notes  °[facility]  (336) 832-8035   °[facility]    (336) 832-7272   °[facility] [address] °[place], [place] [address] (336) 832-4777   °[facility] [address], °[place] (336) 271-4999   °[facility]    (336) 373-0678   °[facility]    (336) 272-1050   °[facility] ° [address], [place] Phone:  (336) 832-4444, Fax:  (336) 832-4440 Hours of Operation:  9 am - 6 pm, M-F.  Also accepts Medicaid/Medicare and self-pay.  °[facility] ° [address], [place] Phone: (336) 832-3150, Fax: (336) [phone]. Hours of Operation:  8:30 am - 5:30 pm, M-F.  Also accepts Medicaid and self-pay.  °[facility] [place] [address], [place] Phone: (336) 878-6027   °[facility] [address], [place], [place] (336)723-1848, Ext. 123 Mondays & Thursdays: 7-9 AM.  First 15 patients are seen on a first come, first serve basis. °  ° °Medicaid-accepting [place] Providers: ° °Organization         Address  Phone   Notes  °[facility] [address], [place] (336) 641-2100 Also accepts self-pay patients.  °[facility] [address], [place] ° (336) 856-9996   °[facility] [address], [place] (336) 288-8857   °[facility] [address] [place] [address], [place] (336) 299-7000   °Veita Bland  [address], [place]  ° (336) 373-1557 Only accepts [place] Access Medicaid patients after they have their name applied to their card.  ° °Self-Pay (no insurance) in [place]: ° °Organization         Address  Phone   Notes  °Sickle Cell Patients, [facility] [address], [place] (336) 832-1970   °[facility] [address], [place] (336) 832-4400   °[facility] ° [address], [place] (336) 992-4800   °[facility]/Dr. Osei-Bonsu ° [address] [place] [address], [place] or [address], [place] (336) [phone] Phone number for both [place] and [place] locations is the same.  °[facility] [address], [place] (336) 299-0000   °[facility] [address] [place] [address], [place] or [address] (336) 852-7530 °(336) 878-2260   °[facility] [address], [place] (336) 350-1642, phone; (336) 294-5005, fax Sees patients 1st and 3rd Saturday of every month.  Must not qualify for public or private insurance (i.e. Medicaid, Medicare, [place] Health Choice, Veterans' Benefits) • Household income should be no more than 200% of the poverty level •The clinic cannot treat you if you are pregnant or think you are pregnant • Sexually transmitted diseases are not treated at the clinic.  ° ° °Dental Care: °Organization         Address  Phone  Notes  °[facility] [address], [place] (336) 641-6152 Accepts children up to age 21 who are enrolled in Medicaid or [place] Health Choice; pregnant women with a Medicaid card; and children who have applied for Medicaid or [place] Health Choice, but were declined, whose parents can pay a reduced fee at time of service.  °[facility] [place]  [address], [place] (336) 641-7733 Accepts children up to age 21  who are enrolled in Medicaid or [place] Health Choice; pregnant women with a Medicaid card; and children who have applied for Medicaid or [place] Health Choice, but were declined, whose parents can pay a reduced fee at time of service.  °[facility] ° [address], [place] (336) 641-4533 Patients are seen by appointment only. Walk-ins are not accepted. [facility] will see patients 18 years of age and older. °Monday - Tuesday (8am-5pm) °Most Wednesdays (8:30-5pm) °$30 per visit, cash only  °[facility] ° [address], [place] (336) 641-4533 Patients are seen by appointment only. Walk-ins are not accepted. [facility] will see patients 18 years of age and older. °One Wednesday Evening (Monthly: Volunteer Based).  $30 per visit, cash only  °[facility]  (919) 537-3737 for adults; Children under age 4, call Graduate Pediatric Dentistry at (919) [phone]. Children aged 4-14, please call (919) [phone] to request a pediatric application. ° Dental services are provided in all areas of dental care including fillings, crowns and bridges, complete and partial dentures, implants, gum treatment, root canals, and extractions. Preventive care is also provided. Treatment is provided to both adults and children. °Patients are selected via a lottery and there is often a waiting list. °  °[facility] [address], °[place] ° (336) 763-8833 [email or web address] °  °[facility] [address], [place], [place] (336)723-1848, Ext. 123 Second and Fourth Thursday of each month, opens at 6:30 AM; Clinic ends at 9 AM.  Patients are seen on a first-come first-served basis, and a limited number are seen during each clinic.  ° °[facility] ° [address], [place], [place] (336) 723-7904   Eligibility Requirements °You must have lived in [place], [place], or [place] counties for at least the last three months. °   You cannot be eligible for state or federal sponsored healthcare insurance, including Veterans Administration, Medicaid, or Medicare. °  You generally cannot be eligible for healthcare insurance through your employer.  °  How to apply: °Eligibility screenings are held every Tuesday and Wednesday afternoon from 1:00 pm until 4:00 pm. You do not need an appointment for the interview!  °[facility] [address], [place], [place] 336-631-2330   °[facility] [facility]  336-342-8273   °[facility]  336-703-3100   °[facility] [place] [facility]  336-570-6415   ° °Behavioral Health Resources in the Community: °Intensive Outpatient Programs °Organization         Address  Phone  Notes  °[facility] [address], [place], [place] 336-878-6098   °[facility] [address], [place], [place] 336-832-9800   °[facility] [address], [place], [place] ° 336-882-2125   °[facility] [address],  °[place], [place] 1-800-853-5163 or 336-641-4981   °Substance Abuse Resources °Organization         Address  Phone  Notes  °[facility]  336-882-2125   °[facility]  336-784-9470   °[facility]  336-285-9073   °[facility]  336-845-3988   °Residential & Outpatient Substance Abuse Program  1-800-659-3381   °Psychological Services °Organization         Address  Phone  Notes  °[facility]  336- 832-9600   °[facility]  336- 378-7881   °[facility] [address], [place] 1-800-853-5163 or 336-641-4981   ° °Mobile Crisis Teams °Organization         Address  Phone  Notes  °[facility], Mobile Crisis Care Unit  1-877-626-1772   °Assertive °[facility] ° [address] [place], [place] 336-834-9664   °Sharon DeEsch [address] °[place] 336-554-5454   ° °Self-Help/Support  Groups °Organization         Address  Phone             Notes  °[facility] [place] - variety of support groups  336- 373-1402 Call for more information  °Narcotics Anonymous (NA), [facility] [address], °[place]  2 meetings at this location  ° °Residential Treatment Programs °Organization         Address  Phone  Notes  °[facility] [address],    °[place]  1-866-801-8205   °[facility] ° [address], [place], [place] 704-293-8524   °[facility] [address], [place] 336-845-3988 Admissions: 8am-3pm M-F  °[facility] [address]    °[place], [place] 336-841-1104   °[facility] [address], [place], [place] 336-379-7146   °[facility] [address]  °[place], [place] 336-285-9073   °[facility] - Intensive Outpatient [address], [place], [place] 336-852-3033   °[facility] [address]  °[place], [place] 1-877-615-2722 or 336-784-9470   °[facility] [address] [place], [place] 336-227-7417 Accepts Medicaid  °[facility] [address]  °[place] 1-800-659-3381 Substance Abuse/Addiction Treatment  ° °[place] Behavioral Health Resources °Organization         Address  Phone  Notes  °[facility]  (888) 581-9988   °Julie Brannon, PhD [address] [place], [place]   (336) 349-5553 or (336) 951-0000   °[facility]   [address] °[place], [place] (336) 349-4454   °[facility] [address], [place], [place] (336) 342-8316 Insurance/Medicaid/sponsorship through [facility]  °[facility] [address]                                      [place], [place] (336) 342-8316 Therapy/tele-psych/case  °[facility] [address]  ° [place], [place] (336) 349-2233    °Dr. Arfeen  (336) 349-4544   °[facility] [place]  [employer] [place] [facility] 1) [address], [place] °2) [address], [place] °3)  [address], [place] (336) 349-3220 °(336) 342-7768 ° °(336) 342-8140   °[place] [facility] (336) 342-1394 or (336) 342-3537 (After Hours)    ° ° ° ° °

## 2015-04-24 DIAGNOSIS — J069 Acute upper respiratory infection, unspecified: Secondary | ICD-10-CM | POA: Diagnosis not present

## 2015-04-27 LAB — CULTURE, GROUP A STREP: Strep A Culture: NEGATIVE

## 2016-09-01 IMAGING — DX DG CHEST 2V
2 series · 2 of 2 positions shown · non-contrast
Comparison: 09/25/2013

CLINICAL DATA: Cough and congestion for 3 days.

EXAM:
CHEST  2 VIEW

[chest lat]
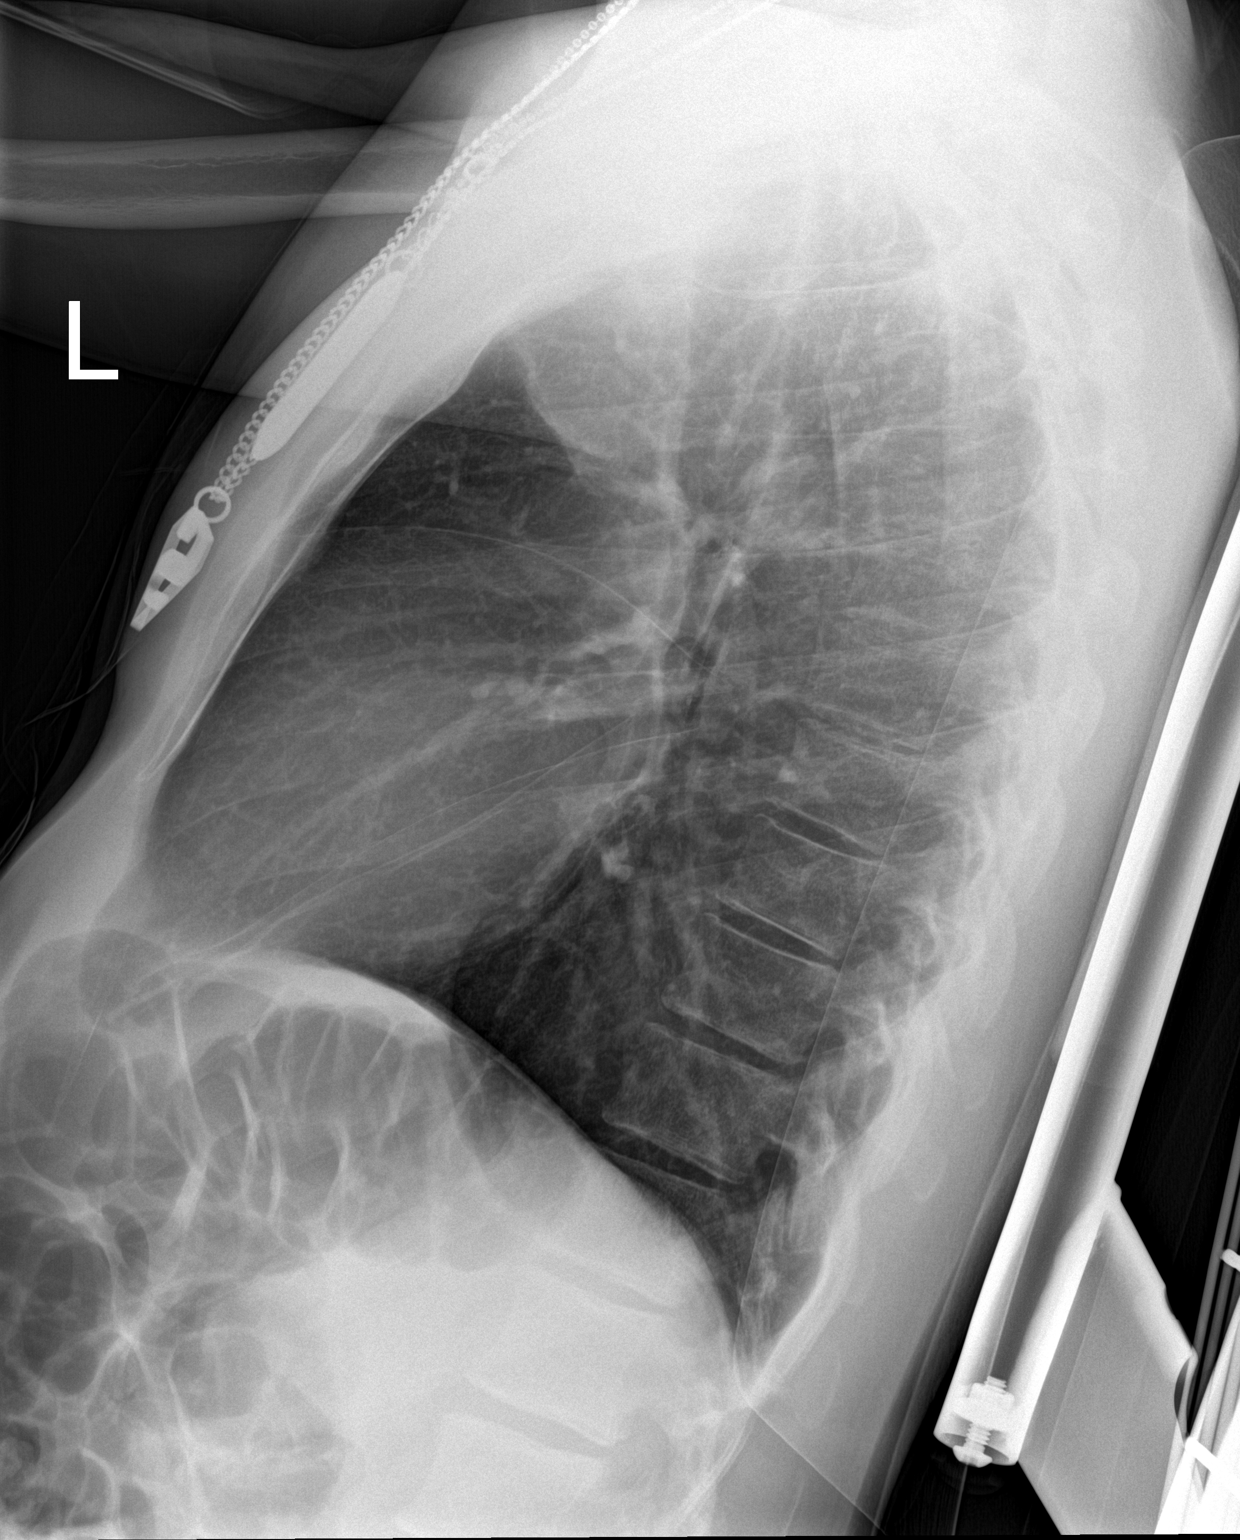

[chest ap]
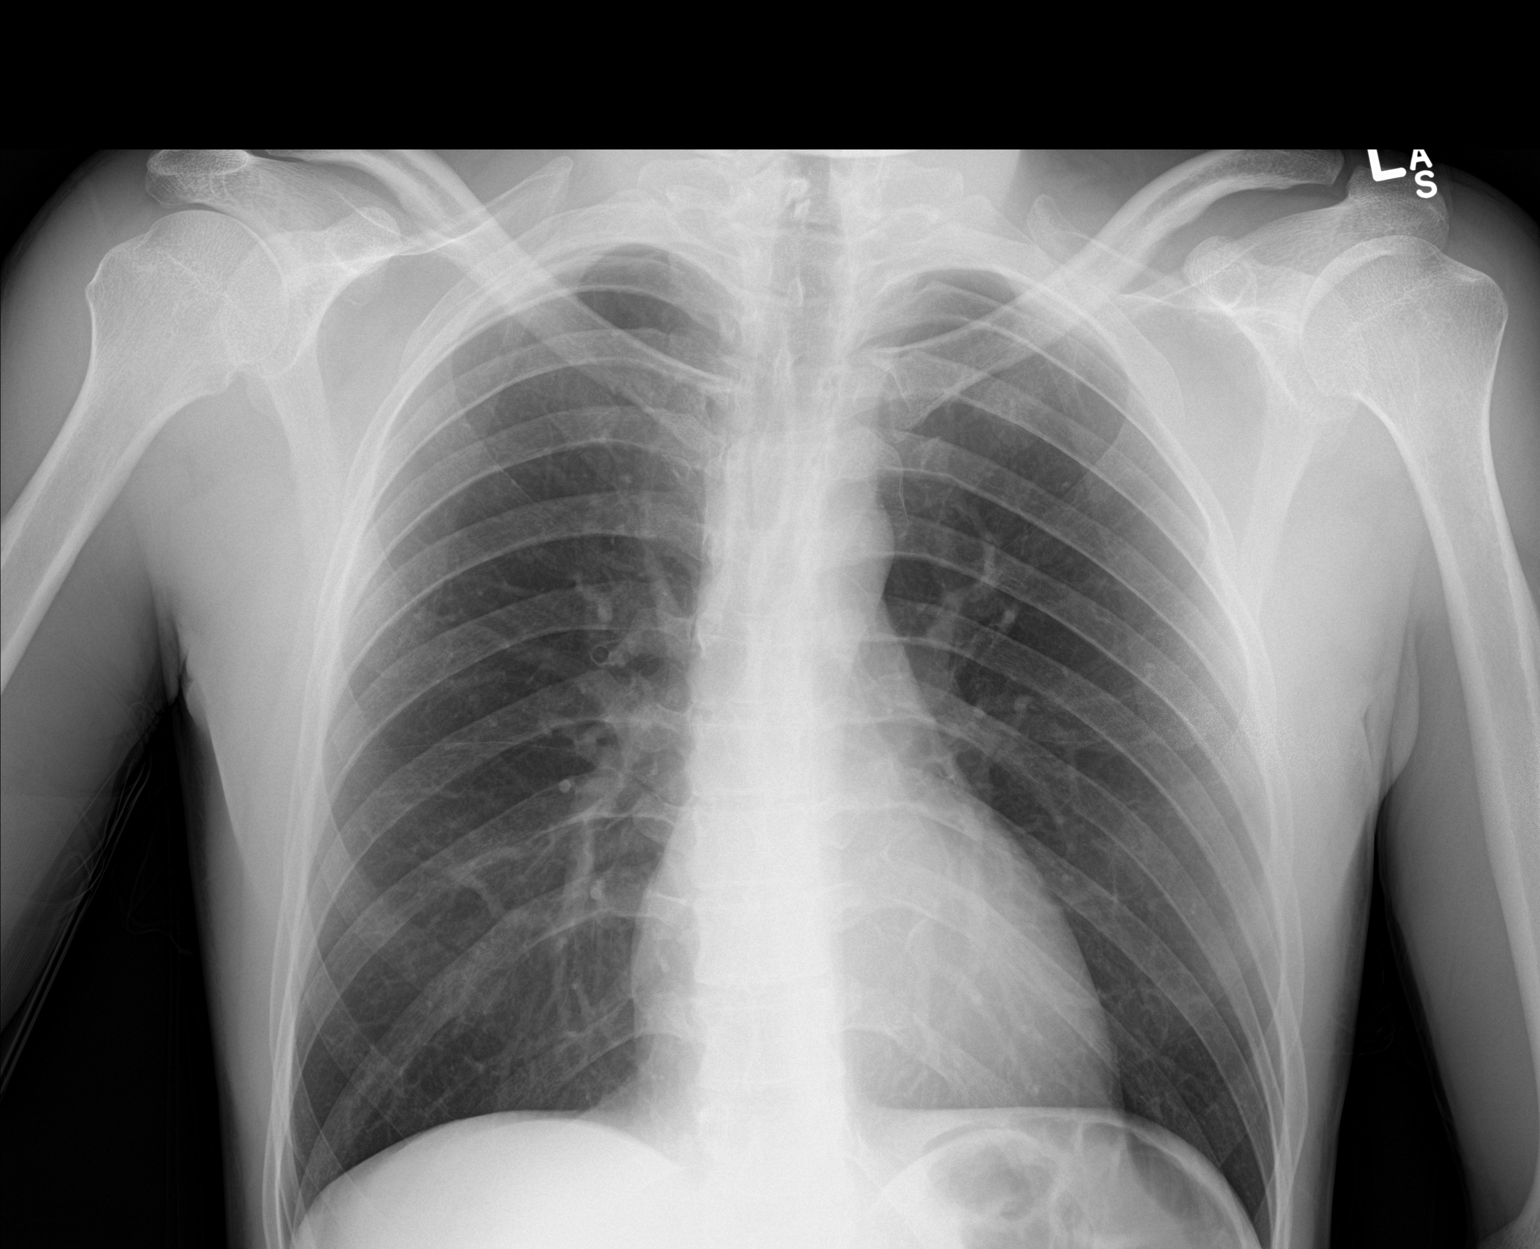

[2 of 2 positions shown; findings below may reference images not displayed]

FINDINGS: The heart size and mediastinal contours are within normal limits.
Both lungs are clear. No pleural effusion or pneumothorax. The
visualized skeletal structures are unremarkable.
IMPRESSION: Normal chest radiographs

## 2020-11-27 HISTORY — PX: CHOLECYSTECTOMY: SHX55

## 2023-01-11 DIAGNOSIS — F70 Mild intellectual disabilities: Secondary | ICD-10-CM | POA: Insufficient documentation

## 2023-01-11 DIAGNOSIS — F639 Impulse disorder, unspecified: Secondary | ICD-10-CM | POA: Insufficient documentation

## 2023-01-11 DIAGNOSIS — F0630 Mood disorder due to known physiological condition, unspecified: Secondary | ICD-10-CM | POA: Insufficient documentation

## 2023-08-22 ENCOUNTER — Ambulatory Visit
Admission: EM | Admit: 2023-08-22 | Discharge: 2023-08-22 | Disposition: A | Discharge status: Home / Self Care | Payer: Medicare Other | Attending: Internal Medicine | Admitting: Internal Medicine

## 2023-08-22 DIAGNOSIS — J209 Acute bronchitis, unspecified: Secondary | ICD-10-CM | POA: Diagnosis not present

## 2023-08-22 MED ORDER — ALBUTEROL SULFATE HFA 108 (90 BASE) MCG/ACT IN AERS
1.0000 | INHALATION_SPRAY | Freq: Four times a day (QID) | RESPIRATORY_TRACT | 0 refills | Status: AC | PRN
Start: 2023-08-22 — End: ?

## 2023-08-22 MED ORDER — AMOXICILLIN 875 MG PO TABS
875.0000 mg | ORAL_TABLET | Freq: Two times a day (BID) | ORAL | 0 refills | Status: AC
Start: 2023-08-22 — End: 2023-09-01

## 2023-08-22 MED ORDER — BENZONATATE 200 MG PO CAPS
200.0000 mg | ORAL_CAPSULE | Freq: Three times a day (TID) | ORAL | 0 refills | Status: DC | PRN
Start: 2023-08-22 — End: 2024-06-27

## 2023-08-22 NOTE — ED Triage Notes (Signed)
Pt presents with c/o nasal congestion and emesis X 2 wks. Pt care giver reports he has taken mucinex and it caused his stomach to hurt.

## 2023-08-22 NOTE — Discharge Instructions (Signed)
Start amoxicillin twice daily for 10 days.  Albuterol inhaler as needed for wheezing.  Tessalon as needed for cough.  Lots of rest and fluids.  Please follow-up with your PCP in 2 days for recheck.  Please go to the ER if you develop any worsening symptoms.  I hope you feel better soon!

## 2023-08-22 NOTE — ED Provider Notes (Signed)
[facility]    CSN: [id number] Arrival date & time: 08/22/23  1123      History   Chief Complaint Chief Complaint  Patient presents with   Nasal Congestion   Emesis    HPI Darius Padilla is a 37 y.o. male  presents for evaluation of URI symptoms for 2 weeks. Patient is accompanied by his caregiver.  Patient reports associated symptoms of cough, congestion, wheezing. Denies N/V/D, fevers, ear pain, shortness of breath, sore throat. Patient does not have a hx of asthma.   Pt has taken Mucinex OTC for symptoms.  Patient has a history of gastroparesis and states the Mucinex aggravates this causing him to vomit.  Pt has no other concerns at this time.    Emesis Associated symptoms: cough     Past Medical History:  Diagnosis Date   Delayed gastric emptying    Diverticulitis    Gastroparesis    GERD (gastroesophageal reflux disease)    Mental retardation    Schizophrenia (HCC)    Seizures (HCC)    Generalized epilipsy    There are no problems to display for this patient.   Past Surgical History:  Procedure Laterality Date   TONSILLECTOMY         Home Medications    Prior to Admission medications   Medication Sig Start Date End Date Taking? Authorizing Provider  albuterol (VENTOLIN HFA) 108 (90 Base) MCG/ACT inhaler Inhale 1-2 puffs into the lungs every 6 (six) hours as needed for wheezing or shortness of breath. 08/22/23  Yes Radford Pax, NP  amoxicillin (AMOXIL) 875 MG tablet Take 1 tablet (875 mg total) by mouth 2 (two) times daily for 10 days. 08/22/23 09/01/23 Yes Radford Pax, NP  benzonatate (TESSALON) 200 MG capsule Take 1 capsule (200 mg total) by mouth 3 (three) times daily as needed. 08/22/23  Yes Radford Pax, NP  Asenapine Maleate 10 MG SUBL Place 10 mg under the tongue daily.    [provider]  chlorproMAZINE (THORAZINE) 25 MG tablet Take 25 mg by mouth 3 (three) times daily as needed for nausea or vomiting.    [provider]   clonazePAM (KLONOPIN) 1 MG tablet Take 1 mg by mouth 2 (two) times daily as needed for anxiety.    [provider]  divalproex (DEPAKOTE ER) 250 MG 24 hr tablet Take 500 mg by mouth daily.     [provider]  esomeprazole (NEXIUM) 40 MG capsule Take 40 mg by mouth daily before breakfast.    [provider]  gabapentin (NEURONTIN) 800 MG tablet Take 800 mg by mouth 3 (three) times daily.    [provider]  HYDROcodone-homatropine (HYCODAN) 5-1.5 MG/5ML syrup Take 5 mLs by mouth every 6 (six) hours as needed for cough. 04/23/15   Patel-Mills, Lorelle Formosa, PA-C  OXcarbazepine (TRILEPTAL) 150 MG tablet Take 150 mg by mouth daily.    [provider]  polyethylene glycol (MIRALAX / GLYCOLAX) packet Take 17 g by mouth at bedtime.    [provider]  promethazine (PHENERGAN) 25 MG tablet Take 25 mg by mouth every 6 (six) hours as needed for nausea or vomiting.    [provider]  propranolol (INDERAL) 10 MG tablet Take 10 mg by mouth daily.    [provider]  ranitidine (ZANTAC) 150 MG tablet Take 150 mg by mouth daily.     [provider]  ziprasidone (GEODON) 20 MG capsule Take 20 mg by mouth 2 (two)  times daily with a meal.    [provider]    Family History Family History  Problem Relation Age of Onset   Diabetes Other     Social History Social History   Tobacco Use   Smoking status: Never   Smokeless tobacco: Never  Substance Use Topics   Alcohol use: Yes    Comment: occ   Drug use: No     Allergies   Patient has no known allergies.   Review of Systems Review of Systems  HENT:  Positive for congestion.   Respiratory:  Positive for cough.   Gastrointestinal:  Positive for vomiting.     Physical Exam Triage Vital Signs ED Triage Vitals [08/22/23 1135]  Encounter Vitals Group     BP 125/88     Systolic BP Percentile      Diastolic BP Percentile      Pulse Rate 83     Resp 18      Temp 98.1 F (36.7 C)     Temp Source Oral     SpO2 96 %     Weight      Height      Head Circumference      Peak Flow      Pain Score 4     Pain Loc      Pain Education      Exclude from Growth Chart    No data found.  Updated Vital Signs BP 125/88   Pulse 83   Temp 98.1 F (36.7 C) (Oral)   Resp 18   SpO2 96%   Visual Acuity Right Eye Distance:   Left Eye Distance:   Bilateral Distance:    Right Eye Near:   Left Eye Near:    Bilateral Near:     Physical Exam Vitals and nursing note reviewed.  Constitutional:      General: He is not in acute distress.    Appearance: Normal appearance. He is not ill-appearing or toxic-appearing.  HENT:     Head: Normocephalic and atraumatic.     Right Ear: Tympanic membrane and ear canal normal.     Left Ear: Tympanic membrane and ear canal normal.     Nose: Congestion present.     Mouth/Throat:     Mouth: Mucous membranes are moist.     Pharynx: No posterior oropharyngeal erythema.  Eyes:     Pupils: Pupils are equal, round, and reactive to light.  Cardiovascular:     Rate and Rhythm: Normal rate and regular rhythm.     Heart sounds: Normal heart sounds.  Pulmonary:     Effort: Pulmonary effort is normal.     Breath sounds: Normal breath sounds.  Musculoskeletal:     Cervical back: Normal range of motion and neck supple.  Lymphadenopathy:     Cervical: No cervical adenopathy.  Skin:    General: Skin is warm and dry.  Neurological:     General: No focal deficit present.     Mental Status: He is alert and oriented to person, place, and time.  Psychiatric:        Mood and Affect: Mood normal.        Behavior: Behavior normal.      UC Treatments / Results  Labs (all labs ordered are listed, but only abnormal results are displayed) Labs Reviewed - No data to display  EKG   Radiology No results found.  Procedures Procedures (including critical care time)  Medications Ordered in UC  Medications - No data to  display  Initial Impression / Assessment and Plan / UC Course  I have reviewed the triage vital signs and the nursing notes.  Pertinent labs & imaging results that were available during my care of the patient were reviewed by me and considered in my medical decision making (see chart for details).     Reviewed exam and symptoms with patient and caregiver no red flags.  Albuterol inhaler as needed for wheezing.  Tessalon as needed for cough.  Amoxicillin twice daily for 10 days given length of symptoms.  Advised PCP follow-up 2 days for recheck.  ER precautions reviewed with patient and caregiver verbalized understanding. Final Clinical Impressions(s) / UC Diagnoses   Final diagnoses:  Acute bronchitis, unspecified organism     Discharge Instructions      Start amoxicillin twice daily for 10 days.  Albuterol inhaler as needed for wheezing.  Tessalon as needed for cough.  Lots of rest and fluids.  Please follow-up with your PCP in 2 days for recheck.  Please go to the ER if you develop any worsening symptoms.  I hope you feel better soon!    ED Prescriptions     Medication Sig Dispense Auth. Provider   albuterol (VENTOLIN HFA) 108 (90 Base) MCG/ACT inhaler Inhale 1-2 puffs into the lungs every 6 (six) hours as needed for wheezing or shortness of breath. 1 each Radford Pax, NP   benzonatate (TESSALON) 200 MG capsule Take 1 capsule (200 mg total) by mouth 3 (three) times daily as needed. 20 capsule Radford Pax, NP   amoxicillin (AMOXIL) 875 MG tablet Take 1 tablet (875 mg total) by mouth 2 (two) times daily for 10 days. 20 tablet Radford Pax, NP      PDMP not reviewed this encounter.   Radford Pax, NP 08/22/23 [phone]

## 2024-01-31 ENCOUNTER — Ambulatory Visit: Payer: Medicare (Managed Care) | Admitting: Podiatry

## 2024-02-06 ENCOUNTER — Ambulatory Visit (INDEPENDENT_AMBULATORY_CARE_PROVIDER_SITE_OTHER): Payer: Medicare (Managed Care) | Admitting: Podiatry

## 2024-02-06 DIAGNOSIS — M21611 Bunion of right foot: Secondary | ICD-10-CM

## 2024-02-06 DIAGNOSIS — M79675 Pain in left toe(s): Secondary | ICD-10-CM

## 2024-02-06 DIAGNOSIS — L602 Onychogryphosis: Secondary | ICD-10-CM | POA: Diagnosis not present

## 2024-02-06 DIAGNOSIS — M21612 Bunion of left foot: Secondary | ICD-10-CM | POA: Diagnosis not present

## 2024-02-06 DIAGNOSIS — B351 Tinea unguium: Secondary | ICD-10-CM

## 2024-02-06 DIAGNOSIS — M79674 Pain in right toe(s): Secondary | ICD-10-CM

## 2024-02-06 NOTE — Progress Notes (Signed)
       Subjective:  Patient ID: Darius Padilla, male    DOB: 07-28-1986,  MRN: [id number]   Darius Padilla presents to clinic today for:  Chief Complaint  Patient presents with   RFC    FOOT EVAL/ RFC POSSIBLE NAIL FUNGUS   Patient notes nails are thick, discolored, elongated and painful in shoegear when trying to ambulate.  Patient has special needs and resides with a caregiver in her home.   PCP is Erskine Emery, NP.  Past Medical History:  Diagnosis Date   Delayed gastric emptying    Diverticulitis    Gastroparesis    GERD (gastroesophageal reflux disease)    Mental retardation    Schizophrenia (HCC)    Seizures (HCC)    Generalized epilipsy    Past Surgical History:  Procedure Laterality Date   TONSILLECTOMY      No Known Allergies  Review of Systems: Negative except as noted in the HPI.  Objective:  Darius Padilla is a pleasant 38 y.o. male in NAD. AAO x 3.  Vascular Examination: Capillary refill time is 3-5 seconds to toes bilateral. Palpable pedal pulses b/l LE. Digital hair present b/l.  Skin temperature gradient WNL b/l. No varicosities b/l. No cyanosis noted b/l.   Dermatological Examination: Pedal skin with normal turgor, texture and tone b/l. No open wounds. No interdigital macerations b/l. Toenails x10 are 3mm thick, discolored, dystrophic with subungual debris. There is pain with compression of the nail plates.  They are elongated x10  Neurological Examination: Protective sensation intact bilateral LE.   Musculoskeletal Examination: Bony prominence on medial 1st met. Head b/l, consistent with bunion deformity.  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    Assessment/Plan: 1. Pain due to onychomycosis of toenails of both feet   2. Bunion, left foot   3. Bunion, right foot   4. Onychogryphosis    The mycotic / gryphotic toenails were sharply debrided x10 with sterile nail nippers and a power debriding burr  to decrease bulk/thickness and length.    F/u 3 months   Robyne Matar DBurna Mortimer, DPM, FACFAS [facility]     [address] [place], [place] [address]                Office [phone]  Fax [phone]

## 2024-05-15 ENCOUNTER — Encounter: Payer: Medicare (Managed Care) | Admitting: Podiatry

## 2024-05-16 NOTE — Progress Notes (Signed)
Patient did not show for scheduled appointment today.

## 2024-06-27 ENCOUNTER — Other Ambulatory Visit (INDEPENDENT_AMBULATORY_CARE_PROVIDER_SITE_OTHER): Payer: Medicare (Managed Care)

## 2024-06-27 ENCOUNTER — Ambulatory Visit: Payer: Medicare (Managed Care) | Admitting: Gastroenterology

## 2024-06-27 ENCOUNTER — Encounter: Payer: Self-pay | Admitting: Gastroenterology

## 2024-06-27 VITALS — BP 110/76 | HR 95 | Ht 65.0 in | Wt 192.4 lb

## 2024-06-27 DIAGNOSIS — R7989 Other specified abnormal findings of blood chemistry: Secondary | ICD-10-CM

## 2024-06-27 DIAGNOSIS — K760 Fatty (change of) liver, not elsewhere classified: Secondary | ICD-10-CM | POA: Diagnosis not present

## 2024-06-27 LAB — CBC WITH DIFFERENTIAL/PLATELET
Basophils Absolute: 0 K/uL (ref 0.0–0.1)
Basophils Relative: 0.4 % (ref 0.0–3.0)
Eosinophils Absolute: 0 K/uL (ref 0.0–0.7)
Eosinophils Relative: 0.3 % (ref 0.0–5.0)
HCT: 41.6 % (ref 39.0–52.0)
Hemoglobin: 14.5 g/dL (ref 13.0–17.0)
Lymphocytes Relative: 46.2 % — ABNORMAL HIGH (ref 12.0–46.0)
Lymphs Abs: 2 K/uL (ref 0.7–4.0)
MCHC: 34.9 g/dL (ref 30.0–36.0)
MCV: 90.8 fl (ref 78.0–100.0)
Monocytes Absolute: 0.5 K/uL (ref 0.1–1.0)
Monocytes Relative: 11 % (ref 3.0–12.0)
Neutro Abs: 1.9 K/uL (ref 1.4–7.7)
Neutrophils Relative %: 42.1 % — ABNORMAL LOW (ref 43.0–77.0)
Platelets: 269 K/uL (ref 150.0–400.0)
RBC: 4.59 Mil/uL (ref 4.22–5.81)
RDW: 13.1 % (ref 11.5–15.5)
WBC: 4.4 K/uL (ref 4.0–10.5)

## 2024-06-27 LAB — COMPREHENSIVE METABOLIC PANEL WITH GFR
ALT: 31 U/L (ref 0–53)
AST: 27 U/L (ref 0–37)
Albumin: 4.5 g/dL (ref 3.5–5.2)
Alkaline Phosphatase: 78 U/L (ref 39–117)
BUN: 10 mg/dL (ref 6–23)
CO2: 29 meq/L (ref 19–32)
Calcium: 9.5 mg/dL (ref 8.4–10.5)
Chloride: 100 meq/L (ref 96–112)
Creatinine, Ser: 0.82 mg/dL (ref 0.40–1.50)
GFR: 110.07 mL/min (ref >60.00)
Glucose, Bld: 104 mg/dL — ABNORMAL HIGH (ref 70–99)
Potassium: 4.3 meq/L (ref 3.5–5.1)
Sodium: 138 meq/L (ref 135–145)
Total Bilirubin: 0.4 mg/dL (ref 0.2–1.2)
Total Protein: 7.7 g/dL (ref 6.0–8.3)

## 2024-06-27 LAB — PROTIME-INR
INR: 1.1 ratio — ABNORMAL HIGH (ref 0.8–1.0)
Prothrombin Time: 11.9 s (ref 9.6–13.1)

## 2024-06-27 LAB — IBC + FERRITIN
Ferritin: 32.8 ng/mL (ref 22.0–322.0)
Iron: 122 ug/dL (ref 42–165)
Saturation Ratios: 30.6 % (ref 20.0–50.0)
TIBC: 399 ug/dL (ref 250.0–450.0)
Transferrin: 285 mg/dL (ref 212.0–360.0)

## 2024-06-27 NOTE — Progress Notes (Addendum)
 06/27/2024 Darius Padilla [id number] 07/22/1986   HISTORY OF PRESENT ILLNESS:  This is a 38 year old male who is new to our office.  Sent for evaluation of elevated LFTs.  We do not have any recent labs, but it looks like AST and ALT have been elevated slightly back in 2022/2023.  Ultrasound in 2022 showed hepatic steatosis.  He is also on Depakote  which he is has been on for well over 17 years and is on asenapine for the last 4 years or so as well.  Of note, he previously followed with [facility] gastroenterology that is now through [facility] for GERD and gastroparesis.  He has had endoscopy and colonoscopy from there.  I do see in the system December 2022 he had a normal endoscopy with only a 4 cm hiatal hernia.  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  He is here today with his foster mother.  Past Medical History:  Diagnosis Date   Delayed gastric emptying    Diverticulitis    Gastroparesis    GERD (gastroesophageal reflux disease)    Mental retardation    Schizophrenia (HCC)    Seizures (HCC)    Generalized epilipsy   Past Surgical History:  Procedure Laterality Date   CHOLECYSTECTOMY N/A 2022   TONSILLECTOMY      reports that he has never smoked. He has never used smokeless tobacco. He reports current alcohol use. He reports that he does not use drugs. family history includes Diabetes in an other family member; Hypertension in his mother. No Known Allergies    Outpatient Encounter Medications as of 06/27/2024  Medication Sig   acetaminophen (TYLENOL) 325 MG tablet Take by mouth every 6 (six) hours as needed.   albuterol  (VENTOLIN  HFA) 108 (90 Base) MCG/ACT inhaler Inhale 1-2 puffs into the lungs every 6 (six) hours as needed for wheezing or shortness of breath.   Asenapine Maleate 10 MG SUBL Place 10 mg under the tongue daily.   cetirizine (ZYRTEC) 10 MG tablet Take 10 mg by mouth daily.   chlorproMAZINE  (THORAZINE) 25 MG tablet Take 25 mg by mouth 3 (three) times daily as needed for nausea or vomiting.   divalproex  (DEPAKOTE  ER) 250 MG 24 hr tablet Take 500 mg by mouth daily.  (Patient taking differently: Take 500 mg by mouth in the morning and at bedtime.)   omeprazole (PRILOSEC) 40 MG capsule Take 40 mg by mouth daily.   ondansetron (ZOFRAN) 4 MG tablet Take by mouth.   polyethylene glycol (MIRALAX / GLYCOLAX) packet Take 17 g by mouth at bedtime.   promethazine (PHENERGAN) 25 MG tablet Take 25 mg by mouth every 6 (six) hours as needed for nausea or vomiting.   propranolol (INDERAL) 10 MG tablet Take 10 mg by mouth daily.   ranitidine (ZANTAC) 150 MG tablet Take 150 mg by mouth daily.    ziprasidone (GEODON) 20 MG capsule Take 20 mg by mouth 2 (two) times daily with a meal.   [DISCONTINUED] benzonatate  (TESSALON ) 200 MG capsule Take 1 capsule (200 mg total) by mouth 3 (three) times daily as needed.   [DISCONTINUED] clonazePAM (KLONOPIN) 1 MG tablet Take 1 mg by mouth 2 (two) times daily as needed for anxiety.   [DISCONTINUED] esomeprazole (NEXIUM) 40 MG capsule Take 40 mg by mouth daily before breakfast.   [DISCONTINUED] gabapentin (NEURONTIN) 800 MG tablet Take 800 mg by mouth 3 (three) times daily.   [DISCONTINUED] HYDROcodone -homatropine (HYCODAN) 5-1.5  MG/5ML syrup Take 5 mLs by mouth every 6 (six) hours as needed for cough.   [DISCONTINUED] OXcarbazepine (TRILEPTAL) 150 MG tablet Take 150 mg by mouth daily.   No facility-administered encounter medications on file as of 06/27/2024.    REVIEW OF SYSTEMS  : All other systems reviewed and negative except where noted in the History of Present Illness.   PHYSICAL EXAM: Ht 5' 5 (1.651 m)   Wt 192 lb 6 oz (87.3 kg)   BMI 32.01 kg/m  General: Well developed AA male in no acute distress Head: Normocephalic and atraumatic Eyes:  Sclerae anicteric, conjunctiva pink. Ears: Normal auditory acuity Lungs: Clear throughout to auscultation; no  W/R/R. Heart: Regular rate and rhythm; no M/R/G. Musculoskeletal: Symmetrical with no gross deformities  Skin: No lesions on visible extremities Extremities: No edema  Neurological: Alert oriented x 4, grossly non-focal Psychological:  Alert and cooperative. Normal mood and affect  ASSESSMENT AND PLAN: *38 year old male sent for evaluation of elevated LFTs.  We do not have any recent labs, but it looks like AST and ALT have been elevated slightly back in 2022/2023.  Ultrasound in 2022 showed hepatic steatosis.  He is also on Depakote  which he is has been on for well over 17 years and is on asenapine for the last 4 years or so as well, both of which can cause elevated LFTs.  Will check an ultrasound with elastography.  Will check CBC, CMP, PT/INR, and other liver serologies to rule out other causes of chronic liver disease.  -Of note, he previously followed with [facility] gastroenterology that is now through [facility].  He has had endoscopy and colonoscopy from there.  We will request those records.  I do see in the system December 2022 he had a normal endoscopy with only a 4 cm hiatal hernia.  **Addendum: Received records from [facility].  EGD from June 2024 showed normal esophagus, normal stomach, normal 1st and 2nd part of the duodenum.  Colonoscopy June 2024 showed that the terminal ileum was intubated for 10 cm and was normal.  Had medium internal hemorrhoids seen on rectal retroflexion.  Repeat colonoscopy recommended in 10 years.    Duodenal biopsies showed preserved villous architecture and focal mildly increased epithelial lymphocytic infiltrate with no evidence of acute inflammation or malignancy.  Records sent for scanning.   CC:  Duwaine Burnard Amble, NP

## 2024-06-27 NOTE — Patient Instructions (Signed)
 Your provider has requested that you go to the basement level for lab work before leaving today. Press B on the elevator. The lab is located at the first door on the left as you exit the elevator.  Due to recent changes in healthcare laws, you may see the results of your imaging and laboratory studies on MyChart before your provider has had a chance to review them.  We understand that in some cases there may be results that are confusing or concerning to you. Not all laboratory results come back in the same time frame and the provider may be waiting for multiple results in order to interpret others.  Please give us  48 hours in order for your provider to thoroughly review all the results before contacting the office for clarification of your results.   You have been scheduled for an abdominal ultrasound at [facility] (1st floor of hospital) on 07/04/24 at 8:30 am. Please arrive 15 minutes prior to your appointment for registration. Make certain not to have anything to eat or drink after midnight prior to your appointment. Should you need to reschedule your appointment, please contact radiology at [phone]. This test typically takes about 30 minutes to perform.  Thank you for entrusting me with your care and for choosing [employer], Darius Padilla, P.A. - C.  _______________________________________________________  If your blood pressure at your visit was 140/90 or greater, please contact your primary care physician to follow up on this.  _______________________________________________________  If you are age 40 or older, your body mass index should be between 23-30. Your Body mass index is 32.01 kg/m. If this is out of the aforementioned range listed, please consider follow up with your Primary Care Provider.  If you are age 24 or younger, your body mass index should be between 19-25. Your Body mass index is 32.01 kg/m. If this is out of the aformentioned range listed, please  consider follow up with your Primary Care Provider.   ________________________________________________________  The [facility] providers would like to encourage you to use MYCHART to communicate with providers for non-urgent requests or questions.  Due to long hold times on the telephone, sending your provider a message by [facility] may be a faster and more efficient way to get a response.  Please allow 48 business hours for a response.  Please remember that this is for non-urgent requests.  _______________________________________________________  [facility] is using a team-based approach to care.  Your team is made up of your doctor and two to three APPS. Our APPS (Nurse Practitioners and Physician Assistants) work with your physician to ensure care continuity for you. They are fully qualified to address your health concerns and develop a treatment plan. They communicate directly with your gastroenterologist to care for you. Seeing the Advanced Practice Practitioners on your physician's team can help you by facilitating care more promptly, often allowing for earlier appointments, access to diagnostic testing, procedures, and other specialty referrals.

## 2024-07-01 LAB — ANTI-NUCLEAR AB-TITER (ANA TITER): ANA Titer 1: 1:40 {titer} — ABNORMAL HIGH

## 2024-07-01 LAB — ALPHA-1-ANTITRYPSIN: A-1 Antitrypsin, Ser: 129 mg/dL (ref 83–199)

## 2024-07-01 LAB — TISSUE TRANSGLUTAMINASE ABS,IGG,IGA
(tTG) Ab, IgA: <1 U/mL
(tTG) Ab, IgG: <1 U/mL

## 2024-07-01 LAB — IGA: Immunoglobulin A: 174 mg/dL (ref 47–310)

## 2024-07-01 LAB — CERULOPLASMIN: Ceruloplasmin: 25 mg/dL (ref 14–30)

## 2024-07-01 LAB — MITOCHONDRIAL ANTIBODIES: Mitochondrial M2 Ab, IgG: <=20 U (ref <=20.0)

## 2024-07-01 LAB — IGG: IgG (Immunoglobin G), Serum: 1417 mg/dL (ref 600–1640)

## 2024-07-01 LAB — ANA: Anti Nuclear Antibody (ANA): POSITIVE — AB

## 2024-07-02 LAB — ANTI-SMOOTH MUSCLE ANTIBODY, IGG: Actin (Smooth Muscle) Antibody (IGG): <20 U (ref <20)

## 2024-07-04 ENCOUNTER — Ambulatory Visit (HOSPITAL_COMMUNITY)
Admission: RE | Admit: 2024-07-04 | Discharge: 2024-07-04 | Disposition: A | Discharge status: Home / Self Care | Payer: Medicare (Managed Care) | Source: Ambulatory Visit | Attending: Gastroenterology | Admitting: Gastroenterology

## 2024-07-04 DIAGNOSIS — R7989 Other specified abnormal findings of blood chemistry: Secondary | ICD-10-CM | POA: Diagnosis present

## 2024-07-10 ENCOUNTER — Ambulatory Visit: Payer: Self-pay | Admitting: Gastroenterology

## 2024-12-18 ENCOUNTER — Ambulatory Visit: Payer: Medicare (Managed Care) | Admitting: Podiatry

## 2024-12-31 ENCOUNTER — Ambulatory Visit: Payer: Medicare (Managed Care) | Admitting: Podiatry

## 2025-01-21 ENCOUNTER — Ambulatory Visit: Payer: Medicare (Managed Care) | Admitting: Podiatry
